# Patient Record
Sex: Female | Born: 1960 | Race: White | Hispanic: No | Marital: Married | Smoking: Former smoker
Health system: Southern US, Community
[De-identification: ages and names within clinical notes are randomized; demographics above are authoritative.]

## PROBLEM LIST (undated history)

## (undated) DIAGNOSIS — R32 Unspecified urinary incontinence: Secondary | ICD-10-CM

## (undated) DIAGNOSIS — J449 Chronic obstructive pulmonary disease, unspecified: Secondary | ICD-10-CM

## (undated) DIAGNOSIS — F419 Anxiety disorder, unspecified: Secondary | ICD-10-CM

## (undated) HISTORY — PX: BREAST BIOPSY: SHX20

## (undated) HISTORY — PX: AUGMENTATION MAMMAPLASTY: SUR837

## (undated) HISTORY — DX: Anxiety disorder, unspecified: F41.9

## (undated) HISTORY — DX: Unspecified urinary incontinence: R32

---

## 1992-02-25 HISTORY — PX: BREAST EXCISIONAL BIOPSY: SUR124

## 1998-03-15 ENCOUNTER — Other Ambulatory Visit: Admission: RE | Admit: 1998-03-15 | Discharge: 1998-03-15 | Payer: Self-pay | Admitting: Obstetrics and Gynecology

## 1998-07-03 ENCOUNTER — Encounter: Payer: Self-pay | Admitting: Emergency Medicine

## 1998-07-03 ENCOUNTER — Emergency Department (HOSPITAL_COMMUNITY): Admission: EM | Admit: 1998-07-03 | Discharge: 1998-07-03 | Payer: Self-pay | Admitting: Emergency Medicine

## 1999-12-19 ENCOUNTER — Other Ambulatory Visit: Admission: RE | Admit: 1999-12-19 | Discharge: 1999-12-19 | Payer: Self-pay | Admitting: Gynecology

## 1999-12-30 ENCOUNTER — Encounter (INDEPENDENT_AMBULATORY_CARE_PROVIDER_SITE_OTHER): Payer: Self-pay | Admitting: Specialist

## 1999-12-30 ENCOUNTER — Other Ambulatory Visit: Admission: RE | Admit: 1999-12-30 | Discharge: 1999-12-30 | Payer: Self-pay | Admitting: Obstetrics and Gynecology

## 2000-06-05 ENCOUNTER — Encounter: Admission: RE | Admit: 2000-06-05 | Discharge: 2000-09-03 | Payer: Self-pay | Admitting: Obstetrics and Gynecology

## 2000-08-07 ENCOUNTER — Inpatient Hospital Stay (HOSPITAL_COMMUNITY): Admission: AD | Admit: 2000-08-07 | Discharge: 2000-08-10 | Payer: Self-pay | Admitting: Obstetrics and Gynecology

## 2000-12-02 ENCOUNTER — Other Ambulatory Visit: Admission: RE | Admit: 2000-12-02 | Discharge: 2000-12-02 | Payer: Self-pay | Admitting: Obstetrics & Gynecology

## 2001-10-05 ENCOUNTER — Inpatient Hospital Stay (HOSPITAL_COMMUNITY): Admission: AD | Admit: 2001-10-05 | Discharge: 2001-10-05 | Payer: Self-pay | Admitting: Obstetrics & Gynecology

## 2001-12-02 ENCOUNTER — Other Ambulatory Visit: Admission: RE | Admit: 2001-12-02 | Discharge: 2001-12-02 | Payer: Self-pay | Admitting: Obstetrics and Gynecology

## 2002-01-11 ENCOUNTER — Encounter: Admission: RE | Admit: 2002-01-11 | Discharge: 2002-01-11 | Payer: Self-pay | Admitting: Obstetrics and Gynecology

## 2002-01-11 ENCOUNTER — Encounter: Payer: Self-pay | Admitting: Obstetrics and Gynecology

## 2002-12-21 ENCOUNTER — Other Ambulatory Visit: Admission: RE | Admit: 2002-12-21 | Discharge: 2002-12-21 | Payer: Self-pay | Admitting: Obstetrics and Gynecology

## 2003-12-27 ENCOUNTER — Other Ambulatory Visit: Admission: RE | Admit: 2003-12-27 | Discharge: 2003-12-27 | Payer: Self-pay | Admitting: Obstetrics and Gynecology

## 2004-03-22 ENCOUNTER — Emergency Department (HOSPITAL_COMMUNITY): Admission: EM | Admit: 2004-03-22 | Discharge: 2004-03-22 | Payer: Self-pay | Admitting: *Deleted

## 2004-06-16 ENCOUNTER — Emergency Department (HOSPITAL_COMMUNITY): Admission: EM | Admit: 2004-06-16 | Discharge: 2004-06-16 | Payer: Self-pay | Admitting: Emergency Medicine

## 2004-07-04 ENCOUNTER — Ambulatory Visit (HOSPITAL_COMMUNITY): Admission: RE | Admit: 2004-07-04 | Discharge: 2004-07-04 | Payer: Self-pay | Admitting: Obstetrics and Gynecology

## 2004-07-08 ENCOUNTER — Encounter: Admission: RE | Admit: 2004-07-08 | Discharge: 2004-07-08 | Payer: Self-pay | Admitting: Obstetrics and Gynecology

## 2004-11-12 ENCOUNTER — Ambulatory Visit (HOSPITAL_COMMUNITY): Admission: RE | Admit: 2004-11-12 | Discharge: 2004-11-12 | Payer: Self-pay | Admitting: Obstetrics and Gynecology

## 2004-11-12 ENCOUNTER — Encounter (INDEPENDENT_AMBULATORY_CARE_PROVIDER_SITE_OTHER): Payer: Self-pay | Admitting: Specialist

## 2007-04-29 ENCOUNTER — Emergency Department (HOSPITAL_COMMUNITY): Admission: EM | Admit: 2007-04-29 | Discharge: 2007-04-30 | Payer: Self-pay | Admitting: Emergency Medicine

## 2007-05-05 ENCOUNTER — Encounter: Admission: RE | Admit: 2007-05-05 | Discharge: 2007-05-05 | Payer: Self-pay | Admitting: Neurology

## 2007-09-02 ENCOUNTER — Encounter: Admission: RE | Admit: 2007-09-02 | Discharge: 2007-09-02 | Payer: Self-pay | Admitting: Neurology

## 2010-03-17 ENCOUNTER — Encounter: Payer: Self-pay | Admitting: Neurology

## 2010-07-12 NOTE — Op Note (Signed)
Taylor Hoover, Taylor Hoover               ACCOUNT NO.:  [id number]   MEDICAL RECORD NO.:  [id number]          PATIENT TYPE:  AMB   LOCATION:  SDC                           FACILITY:  [facility]   PHYSICIAN:  Miguel Aschoff, M.D.       DATE OF BIRTH:  29-Jul-1960   DATE OF PROCEDURE:  11/12/2004  DATE OF DISCHARGE:                                 OPERATIVE REPORT   PREOPERATIVE DIAGNOSIS:  Endometrial polyp.   POSTOPERATIVE DIAGNOSIS:  Endometrial polyp.   PROCEDURE:  Hysteroscopy, endometrial polypectomy.   SURGEON:  Dr. Miguel Aschoff.   ANESTHESIA:  IV sedation with paracervical block.   COMPLICATIONS:  None.   JUSTIFICATION:  The patient is a 50 year old white female  found on  sonohysterogram to have a large endometrial polyp.  Due to the lesion within  the uterus, she is being taken now to undergo hysteroscopy and polypectomy.  The risks and benefits of the procedure were discussed with the patient.   PROCEDURE:  The patient was taken to the operating, placed in the supine  position, and IV sedation was administered without difficulty.  Once this  was done, she was placed in the dorsal lithotomy position, prepped and  draped in usual sterile fashion.  The bladder was catheterized.  Examination  revealed the uterus to be anterior, normal size and shape.  The cervix  revealed no lesions.  There were no lesions of the vaginal vault.  The  external genitalia were within normal limits.  A speculum was placed in the  vaginal vault, the anterior cervical lip was grasped with a tenaculum and  then the cervix was injected with 18 mL of 1% Xylocaine by placing 6 mL at  the 12, 4 and 8 o'clock positions on the cervix.  At that this point using  serial Pratt dilators, the endocervical canal was dilated until a #25 Pratt  dilator could be passed and the diagnostic hysteroscope was advanced through  the endocervix.  No lesions were noted in the endocervix.  On entering the  endometrial cavity, however, a  large polyp was visualized.  No other lesions  were noted.  At this point the hysteroscope was removed, polyp forceps were  introduced and the polyp was removed without difficulty.  Following removal  of the polyp, hysteroscopy was once again carried out to ensure that this  had completely been removed.  This was confirmed.  At this point the  procedure was completed, the hysteroscope was removed.  There was minimal  fluid deficit.  The patient reversed from the anesthetic and taken to the  recovery in satisfactory condition.  The plan is for the patient to come  back to the office  in four weeks for follow-up examination, to call for any problems such as  fever, pain or heavy bleeding.  Medications for home included doxycycline  100 mg twice a day x3 days and Darvocet N-100 one every four hours need for  pain.      Miguel Aschoff, M.D.  Electronically Signed     AR/MEDQ  D:  11/12/2004  T:  11/12/2004  Job:  [id number]   cc:   Leatha Gilding. Mezer, M.D.  Fax: [phone]

## 2010-11-18 LAB — I-STAT 8, (EC8 V) (CONVERTED LAB)
Bicarbonate: 25.2 — ABNORMAL HIGH
Hemoglobin: 16.3 — ABNORMAL HIGH
Operator id: 270651
Sodium: 138
TCO2: 27
pCO2, Ven: 42.2 — ABNORMAL LOW

## 2010-11-18 LAB — CBC
MCHC: 34.4
Platelets: 194
RBC: 4.64
RDW: 11.6

## 2010-11-18 LAB — DIFFERENTIAL
Basophils Absolute: 0
Basophils Relative: 0
Eosinophils Absolute: 0
Neutro Abs: 5.4
Neutrophils Relative %: 76

## 2011-02-20 ENCOUNTER — Other Ambulatory Visit: Payer: Self-pay | Admitting: Otolaryngology

## 2011-02-20 DIAGNOSIS — K1120 Sialoadenitis, unspecified: Secondary | ICD-10-CM

## 2011-03-04 ENCOUNTER — Ambulatory Visit
Admission: RE | Admit: 2011-03-04 | Discharge: 2011-03-04 | Disposition: A | Discharge status: Home / Self Care | Payer: BC Managed Care – PPO | Source: Ambulatory Visit | Attending: Otolaryngology | Admitting: Otolaryngology

## 2011-03-04 DIAGNOSIS — K1120 Sialoadenitis, unspecified: Secondary | ICD-10-CM

## 2013-09-01 ENCOUNTER — Other Ambulatory Visit: Payer: Self-pay | Admitting: Obstetrics and Gynecology

## 2013-09-02 LAB — CYTOLOGY - PAP

## 2013-09-23 ENCOUNTER — Ambulatory Visit (INDEPENDENT_AMBULATORY_CARE_PROVIDER_SITE_OTHER): Payer: BC Managed Care – PPO | Admitting: Obstetrics and Gynecology

## 2013-09-23 ENCOUNTER — Encounter: Payer: Self-pay | Admitting: Obstetrics and Gynecology

## 2013-09-23 VITALS — BP 100/64 | HR 76 | Resp 16 | Ht 65.0 in | Wt 122.8 lb

## 2013-09-23 DIAGNOSIS — R8281 Pyuria: Secondary | ICD-10-CM

## 2013-09-23 DIAGNOSIS — R82998 Other abnormal findings in urine: Secondary | ICD-10-CM

## 2013-09-23 DIAGNOSIS — N393 Stress incontinence (female) (male): Secondary | ICD-10-CM

## 2013-09-23 LAB — POCT URINALYSIS DIPSTICK
Bilirubin, UA: NEGATIVE
Blood, UA: NEGATIVE
Glucose, UA: NEGATIVE
Ketones, UA: NEGATIVE
NITRITE UA: NEGATIVE
PROTEIN UA: NEGATIVE
UROBILINOGEN UA: NEGATIVE
pH, UA: 5

## 2013-09-23 NOTE — Progress Notes (Signed)
Patient ID: Taylor Hoover, female   DOB: May 14, 1960, 53 y.o.   MRN: [id number] GYNECOLOGY VISIT  PCP: None  Referring provider: Miguel AschoffAllan Ross, MD  HPI: 53 y.o.   Married  Caucasian  female   G3P0021 with Patient's last menstrual period was 02/25/2011.   here for   Evaluation of stress urinary incontinence. Leaks with cough, sneeze, laugh, trampoline.  DF - 10 - 12 times a day. NF - at least once.  Can leak for no reason.  Coffee in am.  No sodas. 2 teas per day.  Drinks a lot of water daily.  No diabetes.  Hx GDM.  Voids best when she is in the shower.   No dysuria or hematuria.  No history of UTIs, pyelo, or nephrolithiasis.   Wearing Poise pads and developing a rash due to this.   Having diarrhea.  Takes probiotics.  History of 4th degree laceration with childbirth.  Did have a VCUG to rule out fistula - negative.   Feels like the area between the vagina and rectum is very thin. Has to be very careful with bowel movements.  Can leak stool sometimes.  Also does splinting.  Occurs couple times a month.  Wants a perineorrhaphy.  Had urodynamic in 2006.   Showed stress incontinence.   Having insomnia problems. Hgb:    Urine:  1+WBC's  GYNECOLOGIC HISTORY: Patient's last menstrual period was 02/25/2011. Sexually active:  yes Partner preference: female Contraception:  postmenopausal  Menopausal hormone therapy: n/a DES exposure:   no Blood transfusions:  no  Sexually transmitted diseases:no   GYN procedures and prior surgeries:  Breast Augmentation--[facility] Last mammogram:   2006 normal:[facility]              Last pap and high risk HPV testing:   09-01-13 didn't receive results--?normal. History of abnormal pap smear:  1988 abnormal pap, no colposcopy, no treatment to cervix.  Repeat paps normal.   OB History   Grav Para Term Preterm Abortions TAB SAB Ect Mult Living   3 1   2     1        LIFESTYLE: Exercise:  no             Tobacco:  no Alcohol:    no Drug use:  no  There are no active problems to display for this patient.   Past Medical History  Diagnosis Date  . Anxiety   . Urinary incontinence     Past Surgical History  Procedure Laterality Date  . Augmentation mammaplasty      Current Outpatient Prescriptions  Medication Sig Dispense Refill  . ALPRAZolam (XANAX) 1 MG tablet Take 1 tablet by mouth as needed.       No current facility-administered medications for this visit.     ALLERGIES: Review of patient's allergies indicates no known allergies.  Family History  Problem Relation Age of Onset  . Dementia Mother   . Heart attack Brother 49    dec  . Hypertension Sister   . Diabetes Sister   . Diabetes Sister     History   Social History  . Marital Status: Married    Spouse Name: N/A    Number of Children: N/A  . Years of Education: N/A   Occupational History  . Not on file.   Social History Main Topics  . Smoking status: Never Smoker   . Smokeless tobacco: Not on file  . Alcohol Use: No  . Drug Use: No  .  Sexual Activity: Yes    Partners: Male    Birth Control/ Protection: Post-menopausal   Other Topics Concern  . Not on file   Social History Narrative  . No narrative on file    ROS:  Pertinent items are noted in HPI.  PHYSICAL EXAMINATION:    BP 100/64  Pulse 76  Resp 16  Ht 5\' 5"  (1.651 m)  Wt 122 lb 12.8 oz (55.702 kg)  BMI 20.44 kg/m2  LMP 02/25/2011   Wt Readings from Last 3 Encounters:  09/23/13 122 lb 12.8 oz (55.702 kg)     Ht Readings from Last 3 Encounters:  09/23/13 5\' 5"  (1.651 m)    General appearance: alert, cooperative and appears stated age Head: Normocephalic, without obvious abnormality, atraumatic Neck: no adenopathy, supple, symmetrical, trachea midline and thyroid not enlarged, symmetric, no tenderness/mass/nodules Lungs: clear to auscultation bilaterally Heart: regular rate and rhythm Abdomen: soft, non-tender; no masses,  no  organomegaly Extremities: extremities normal, atraumatic, no cyanosis or edema Skin: Skin color, texture, turgor normal. No rashes or lesions Lymph nodes: Cervical, supraclavicular, and axillary nodes normal. No abnormal inguinal nodes palpated Neurologic: Grossly normal  Pelvic: External genitalia:  no lesions              Urethra:  normal appearing urethra with no masses, tenderness or lesions              Bartholins and Skenes: normal                 Vagina: normal appearing vagina with normal color and discharge, no lesions, minimal cystocele and rectocele.  Good uterine support.               Cervix: normal appearance                 Bimanual Exam:  Uterus:  uterus is normal size, shape, consistency and nontender                                      Adnexa: normal adnexa in size, nontender and no masses                                      Rectovaginal: Confirms.  Thin perineal body.                                       Anus:  normal sphincter tone, no lesions  ASSESSMENT  Genuine stress incontinence.  Urinary frequency.  Heavy caffeine use.  Pyuria on urine dip today.  Asymptomatic.  History of fourth degree laceration.  Fecal incontinence.  Difficulty with bowel movements.  Overdue for mammogram.  PLAN  Proceed with urodynamic testing.  I discussed midurethral slings and perineorrhaphy with patient.  I discussed benefits and risks which include but are not limited to urinary retention, sling erosion/exposure, cystotomy, urinary urgency, infection, slower voiding, painful perineum, continued fecal soiling. Patient understands that she will likely need to see a colon and rectal specialist in the future for rectal sphincter evaluation and treatment.  Try Metamucil!  An After Visit Summary was printed and given to the patient.  30 minutes face to face time of which over 50% was spent in counseling.

## 2013-09-27 ENCOUNTER — Other Ambulatory Visit: Payer: Self-pay | Admitting: Obstetrics and Gynecology

## 2013-09-27 LAB — CULTURE, URINE COMPREHENSIVE

## 2013-09-27 MED ORDER — CIPROFLOXACIN HCL 500 MG PO TABS: 500.0000 mg | ORAL_TABLET | Freq: Two times a day (BID) | ORAL | Status: DC

## 2013-09-27 MED ORDER — FLUCONAZOLE 150 MG PO TABS: 150.0000 mg | ORAL_TABLET | Freq: Once | ORAL | Status: DC

## 2013-09-27 MED ORDER — SULFAMETHOXAZOLE-TRIMETHOPRIM 800-160 MG PO TABS: 1.0000 | ORAL_TABLET | Freq: Two times a day (BID) | ORAL | Status: DC

## 2013-09-28 ENCOUNTER — Telehealth: Payer: Self-pay

## 2013-09-28 NOTE — Telephone Encounter (Signed)
Message copied by Alphonsa OverallIXON, Muhanad Torosyan L on Wed Sep 28, 2013 10:29 AM ------      Message from: Ricki MillerAMUNDSON DE Gwenevere GhaziARVALHO E SILVA, BROOK E      Created: Tue Sep 27, 2013  7:30 PM       Please inform patient of her UTI showing 2 bacteria - Actinobacter and Staphylococcus.      She will need two different antibiotics and a Diflucan prescription just in case she gets a yeast infection from the antibiotics.       I am sending everything to her pharmacy now.       Ciprofloxocin 500 mg po bid for 3 days and Bactrim DS 1 po bid for 3 days.       She will need a test of cure in 10 days.        ------

## 2013-09-28 NOTE — Telephone Encounter (Signed)
LMOVM to call to discuss results of urine culture. 

## 2013-09-29 ENCOUNTER — Telehealth: Payer: Self-pay | Admitting: Obstetrics and Gynecology

## 2013-09-29 NOTE — Telephone Encounter (Signed)
Message copied by Vangie BickerFRANKLIN, SABRINA S on Thu Sep 29, 2013 11:48 AM ------      Message from: Ricki MillerAMUNDSON DE Gwenevere GhaziARVALHO E SILVA, BROOK E      Created: Fri Sep 23, 2013 10:10 AM      Regarding: Precert urodynamics and potential surgery       This is a former patient of mine sent by [facility].            We will need to proceed with urodynamic testing and then surgery which will likely include a TVT Exact Midurethral sling and cystoscopy and perineorrhaphy.            Diagnosis will be genuine stress incontinence, constipation.            Dr. Tenny Crawoss will be assisting.             I will put the [facility] on [employer]'s desk.             Thanks.             ------

## 2013-09-29 NOTE — Telephone Encounter (Signed)
Left message for patient to call back. Need to go over benefits for Urodynamics.

## 2013-09-29 NOTE — Telephone Encounter (Signed)
Patient notified of results and made appt for TOC for 10-11-13.

## 2013-09-29 NOTE — Telephone Encounter (Signed)
Returning a call to Amanda.

## 2013-09-29 NOTE — Telephone Encounter (Signed)
Patient returned call. Patient was advised that per the benefit quote received, she will be responsible for $606.66 when she comes in for urodynamics testing. Patient asked if we accept Care Credit, I advised that we do not. Patient states that she does not have this kind of money just lying around and wants to know if we can set up a payment arrangement. I advised that it is our office policy to collect payment at the time of service, but that I would ask office administration to contact her regarding payment arrangement. When asked that would she be able to pay, she stated $50 per month.

## 2013-10-03 ENCOUNTER — Encounter: Payer: BC Managed Care – PPO | Admitting: Obstetrics and Gynecology

## 2013-10-10 NOTE — Telephone Encounter (Signed)
Our office policy is patient payment portion up front for procedures.  I do recommend urodynamic testing based on patient's history.  She may see if she can do urodynamic testing through Dr. Charlott Rakesoss's office. I do not know what their policy is for payment of procedures.   If patient proceeds with any surgery, our office policy would be the same for patient responsible portion up front.   I anticipate that the patient would meet her deductible with urodynamics and the surgery, so patient may want to keep that in mind.  If she chooses to move forward, she would want to do both in the same calendar year.

## 2013-10-10 NOTE — Telephone Encounter (Signed)
Dr. Edward JollySilva, patient would like to pay $50.00/month which would have her paying the balance off in about 12 months. Please advise how you would like us to handle this situation.  Thanks, Sue LushAndrea

## 2013-10-11 ENCOUNTER — Ambulatory Visit (INDEPENDENT_AMBULATORY_CARE_PROVIDER_SITE_OTHER): Payer: BC Managed Care – PPO | Admitting: *Deleted

## 2013-10-11 VITALS — BP 110/60 | HR 60 | Resp 16 | Ht 65.0 in | Wt 123.0 lb

## 2013-10-11 DIAGNOSIS — N390 Urinary tract infection, site not specified: Secondary | ICD-10-CM

## 2013-10-11 NOTE — Progress Notes (Signed)
Patient in today for TOC.Patient states she "doesn't know" if she has any sx. Patient states she is finish with ABx. Advise pt to call if she has any questions or if she starts noticing sx. - Pt agreed.

## 2013-10-11 NOTE — Telephone Encounter (Signed)
Return call to patient. Patient calling regarding payment arrangements and scheduling surgery. Advised our office is unable to make payment arrangements for such an extended period of time.   Offered option of [facility] see if [facility] can allow payment plan and have urodynamics there.  Advised surgery estimate would be due two weeks prior to surgery. Wanted her to know this while she is deciding on options. Martie LeeSabrina will check estimate for surgery and call her this afternoon, advised patient that this is really just an initial estimate, particularly since has not had urodynamics yet and surgical procedure could change.  [facility] to [facility] after talking to our insurance dept and call me back with her decision.

## 2013-10-11 NOTE — Telephone Encounter (Signed)
Patient has some questions regarding her surgery. °

## 2013-10-11 NOTE — Telephone Encounter (Signed)
Left message for patient to call back so that I can go over surgery benefits with her.

## 2013-10-13 LAB — URINE CULTURE: Colony Count: 40000

## 2013-11-14 ENCOUNTER — Telehealth: Payer: Self-pay | Admitting: *Deleted

## 2013-11-14 NOTE — Telephone Encounter (Signed)
Follow-up call to patient to check on status of urodynamics and surgery. Patient states she plans to have urodynamics done at Dr Tenny Craw' office due to [facility] cost. She states they will discuss surgery after results received. I asked when she was scheduled for urodynamics and patietn states "I dont know, I am on the other line with my mom. Stacy (from Dr Tenny Craw) will be in touch with you".   Any further follow-up needed?

## 2013-11-15 NOTE — Telephone Encounter (Signed)
Taylor Hoover, Just FYI, so you do not have to contact her further. We can defer any outstanding orders on her and  If she does not call back in 30 days then we can complete them.

## 2013-11-15 NOTE — Telephone Encounter (Signed)
No follow up needed.  We will let the patient contact us back if she chooses to proceed with any further care. I will close the encounter.

## 2013-11-17 NOTE — Telephone Encounter (Signed)
See next phone message. Patient will call back when ready to proceed.  Routing to provider for final review. Patient agreeable to disposition. Will close encounter

## 2013-12-09 ENCOUNTER — Other Ambulatory Visit: Payer: Self-pay

## 2013-12-26 ENCOUNTER — Encounter: Payer: Self-pay | Admitting: Obstetrics and Gynecology

## 2014-12-14 ENCOUNTER — Telehealth: Payer: Self-pay | Admitting: Obstetrics and Gynecology

## 2014-12-14 NOTE — Telephone Encounter (Signed)
Patient would like a consult visit with Dr Edward JollySilva to discuss surgery that was mentioned to her before.

## 2014-12-14 NOTE — Telephone Encounter (Signed)
Spoke with patient. Patient states that she would like to come in for a consult with Dr.Silva regarding urinary incontinence. Patient states that over the last year her symptoms have increase and "now it is a health issue for me. I am ready to have any testing and move forward with surgery." Requesting an appointment between 10 am and 1 pm due to her sons school schedule. Appointment scheduled for 10/26 at 1pm with Dr.Silva. Agreeable to date and time.  Routing to provider for final review. Patient agreeable to disposition. Will close encounter.

## 2014-12-20 ENCOUNTER — Telehealth: Payer: Self-pay | Admitting: Obstetrics and Gynecology

## 2014-12-20 ENCOUNTER — Ambulatory Visit: Payer: Self-pay | Admitting: Obstetrics and Gynecology

## 2014-12-20 NOTE — Telephone Encounter (Signed)
Patient called to cancel appointment for today at 1:00 with Dr. Edward JollySilva due to her having to pick up her sick child. Patient says she will call back to reschedule.

## 2014-12-20 NOTE — Telephone Encounter (Signed)
Thank you for the update!

## 2015-06-18 DIAGNOSIS — T8544XA Capsular contracture of breast implant, initial encounter: Secondary | ICD-10-CM | POA: Insufficient documentation

## 2015-06-19 ENCOUNTER — Other Ambulatory Visit: Payer: Self-pay | Admitting: Plastic Surgery

## 2015-06-19 DIAGNOSIS — N644 Mastodynia: Secondary | ICD-10-CM

## 2015-06-22 ENCOUNTER — Ambulatory Visit
Admission: RE | Admit: 2015-06-22 | Discharge: 2015-06-22 | Disposition: A | Discharge status: Home / Self Care | Payer: BLUE CROSS/BLUE SHIELD | Source: Ambulatory Visit | Attending: Plastic Surgery | Admitting: Plastic Surgery

## 2015-06-22 DIAGNOSIS — N644 Mastodynia: Secondary | ICD-10-CM

## 2015-09-11 ENCOUNTER — Other Ambulatory Visit: Payer: Self-pay | Admitting: Plastic Surgery

## 2016-07-31 ENCOUNTER — Other Ambulatory Visit: Payer: Self-pay | Admitting: Plastic Surgery

## 2016-07-31 DIAGNOSIS — Z1231 Encounter for screening mammogram for malignant neoplasm of breast: Secondary | ICD-10-CM

## 2016-08-13 ENCOUNTER — Ambulatory Visit
Admission: RE | Admit: 2016-08-13 | Discharge: 2016-08-13 | Disposition: A | Discharge status: Home / Self Care | Payer: BLUE CROSS/BLUE SHIELD | Source: Ambulatory Visit | Attending: Plastic Surgery | Admitting: Plastic Surgery

## 2016-08-13 DIAGNOSIS — Z1231 Encounter for screening mammogram for malignant neoplasm of breast: Secondary | ICD-10-CM

## 2016-08-14 ENCOUNTER — Other Ambulatory Visit: Payer: Self-pay | Admitting: Plastic Surgery

## 2016-08-14 DIAGNOSIS — R928 Other abnormal and inconclusive findings on diagnostic imaging of breast: Secondary | ICD-10-CM

## 2016-08-18 ENCOUNTER — Ambulatory Visit
Admission: RE | Admit: 2016-08-18 | Discharge: 2016-08-18 | Disposition: A | Discharge status: Home / Self Care | Payer: BLUE CROSS/BLUE SHIELD | Source: Ambulatory Visit | Attending: Plastic Surgery | Admitting: Plastic Surgery

## 2016-08-18 ENCOUNTER — Other Ambulatory Visit: Payer: Self-pay | Admitting: Plastic Surgery

## 2016-08-18 DIAGNOSIS — N63 Unspecified lump in unspecified breast: Secondary | ICD-10-CM

## 2016-08-18 DIAGNOSIS — R928 Other abnormal and inconclusive findings on diagnostic imaging of breast: Secondary | ICD-10-CM

## 2016-08-21 ENCOUNTER — Other Ambulatory Visit: Payer: Self-pay | Admitting: Plastic Surgery

## 2016-08-21 DIAGNOSIS — N63 Unspecified lump in unspecified breast: Secondary | ICD-10-CM

## 2016-08-29 ENCOUNTER — Other Ambulatory Visit: Payer: Self-pay | Admitting: Family Medicine

## 2016-08-29 DIAGNOSIS — N63 Unspecified lump in unspecified breast: Secondary | ICD-10-CM

## 2016-11-20 ENCOUNTER — Inpatient Hospital Stay: Admission: RE | Admit: 2016-11-20 | Payer: BLUE CROSS/BLUE SHIELD | Source: Ambulatory Visit

## 2016-11-20 ENCOUNTER — Other Ambulatory Visit: Payer: BLUE CROSS/BLUE SHIELD

## 2018-09-17 ENCOUNTER — Other Ambulatory Visit: Payer: Self-pay | Admitting: Family Medicine

## 2018-09-17 DIAGNOSIS — Z1231 Encounter for screening mammogram for malignant neoplasm of breast: Secondary | ICD-10-CM

## 2018-09-17 DIAGNOSIS — N6489 Other specified disorders of breast: Secondary | ICD-10-CM

## 2019-06-02 ENCOUNTER — Ambulatory Visit: Payer: BC Managed Care – PPO | Admitting: Podiatry

## 2019-06-02 ENCOUNTER — Other Ambulatory Visit: Payer: Self-pay | Admitting: Podiatry

## 2019-06-02 ENCOUNTER — Other Ambulatory Visit: Payer: Self-pay

## 2019-06-02 ENCOUNTER — Encounter: Payer: Self-pay | Admitting: Podiatry

## 2019-06-02 ENCOUNTER — Ambulatory Visit (INDEPENDENT_AMBULATORY_CARE_PROVIDER_SITE_OTHER): Payer: BC Managed Care – PPO

## 2019-06-02 VITALS — BP 125/68 | HR 69 | Temp 95.1°F | Resp 16

## 2019-06-02 DIAGNOSIS — M205X1 Other deformities of toe(s) (acquired), right foot: Secondary | ICD-10-CM | POA: Diagnosis not present

## 2019-06-02 DIAGNOSIS — M79671 Pain in right foot: Secondary | ICD-10-CM

## 2019-06-02 DIAGNOSIS — M779 Enthesopathy, unspecified: Secondary | ICD-10-CM | POA: Diagnosis not present

## 2019-06-02 DIAGNOSIS — M79674 Pain in right toe(s): Secondary | ICD-10-CM

## 2019-06-02 MED ORDER — DICLOFENAC SODIUM 1 % EX GEL: 2.0000 g | Freq: Four times a day (QID) | CUTANEOUS | 2 refills | Status: DC

## 2019-06-06 NOTE — Progress Notes (Signed)
Subjective:   Patient ID: Taylor Hoover, female   DOB: 59 y.o.   MRN: [id number]   HPI 59 year old female presents the office today for concerns of a possible bunion on the right side.  Is been getting worse over the last 2 to 3 months and she has noticed increased discomfort over this timeframe.  She states that time she will get redness and discoloration on the area of the bump on the big toe joint.  She denies any recent injury.  She said no recent treatment.  No other concerns.   Review of Systems  All other systems reviewed and are negative.  Past Medical History:  Diagnosis Date  . Anxiety   . Urinary incontinence     Past Surgical History:  Procedure Laterality Date  . AUGMENTATION MAMMAPLASTY Bilateral    Re implanted 2017  . BREAST BIOPSY Left   . BREAST EXCISIONAL BIOPSY Left 1994     Current Outpatient Medications:  .  albuterol (PROVENTIL) (2.5 MG/3ML) 0.083% nebulizer solution, Take 2.5 mg by nebulization every 6 (six) hours as needed for wheezing or shortness of breath., Disp: , Rfl:  .  ALPRAZolam (XANAX) 1 MG tablet, Take 1 tablet by mouth as needed., Disp: , Rfl:  .  diclofenac Sodium (VOLTAREN) 1 % GEL, Apply 2 g topically 4 (four) times daily. Rub into affected area of foot 2 to 4 times daily, Disp: 100 g, Rfl: 2  Allergies  Allergen Reactions  . Neosporin Plus Max [employer]  . Benzalkonium Chloride Rash          Objective:  Physical Exam  General: AAO x3, NAD-high anxiety  Dermatological: Skin is warm, dry and supple bilateral. Nails x 10 are well manicured; remaining integument appears unremarkable at this time. There are no open sores, no preulcerative lesions, no rash or signs of infection present.  Vascular: Dorsalis Pedis artery and Posterior Tibial artery pedal pulses are 2/4 bilateral with immedate capillary fill time. Pedal hair growth present. No varicosities and no lower extremity edema present bilateral. There is no pain with calf  compression, swelling, warmth, erythema.   Neruologic: Grossly intact via light touch bilateral.   Musculoskeletal: There is decreased range of motion of the first MPJ on the right side there is crepitation with range of motion.  There is dorsal prominence of the first MPJ with slight erythema point rubs inside shoes.  There is no purple or blue discoloration today.  No other areas of discomfort identified today.  Muscular strength 5/5 in all groups tested bilateral.  Gait: Unassisted, Nonantalgic.       Assessment:   Hallux limitus, capsulitis right first MPJ    Plan:  -Treatment options discussed including all alternatives, risks, and complications -Etiology of symptoms were discussed -X-rays were obtained and reviewed with the patient.  Arthritic changes present the first MPJ.  No evidence of acute fracture. -We discussed treatment options with conservative as well as surgical.  Would continue with conservative care for now.  Dispensed a graphite insert.  Voltaren gel.  Consider steroid injection.  Discussed orthotics with a Morton's extension.  She was measured today by Raiford Noble for this.   Return in about 3 weeks (around 06/23/2019).  Vivi Barrack DPM

## 2019-06-13 ENCOUNTER — Telehealth: Payer: Self-pay | Admitting: Podiatry

## 2019-06-13 NOTE — Telephone Encounter (Signed)
Void-no message required

## 2019-06-14 ENCOUNTER — Other Ambulatory Visit: Payer: BC Managed Care – PPO | Admitting: Orthotics

## 2019-06-15 ENCOUNTER — Ambulatory Visit: Payer: BC Managed Care – PPO | Admitting: Podiatry

## 2019-06-30 ENCOUNTER — Emergency Department (HOSPITAL_COMMUNITY)
Admission: EM | Admit: 2019-06-30 | Discharge: 2019-06-30 | Disposition: A | Discharge status: Home / Self Care | Payer: BLUE CROSS/BLUE SHIELD | Attending: Emergency Medicine | Admitting: Emergency Medicine

## 2019-06-30 ENCOUNTER — Other Ambulatory Visit: Payer: Self-pay

## 2019-06-30 ENCOUNTER — Encounter (HOSPITAL_COMMUNITY): Payer: Self-pay | Admitting: Emergency Medicine

## 2019-06-30 DIAGNOSIS — Y999 Unspecified external cause status: Secondary | ICD-10-CM | POA: Diagnosis not present

## 2019-06-30 DIAGNOSIS — Y92832 Beach as the place of occurrence of the external cause: Secondary | ICD-10-CM | POA: Diagnosis not present

## 2019-06-30 DIAGNOSIS — Z5321 Procedure and treatment not carried out due to patient leaving prior to being seen by health care provider: Secondary | ICD-10-CM | POA: Diagnosis not present

## 2019-06-30 DIAGNOSIS — S9032XA Contusion of left foot, initial encounter: Secondary | ICD-10-CM | POA: Diagnosis not present

## 2019-06-30 DIAGNOSIS — W25XXXA Contact with sharp glass, initial encounter: Secondary | ICD-10-CM | POA: Diagnosis not present

## 2019-06-30 DIAGNOSIS — Y9301 Activity, walking, marching and hiking: Secondary | ICD-10-CM | POA: Insufficient documentation

## 2019-06-30 DIAGNOSIS — S99922A Unspecified injury of left foot, initial encounter: Secondary | ICD-10-CM | POA: Diagnosis present

## 2019-06-30 HISTORY — DX: Chronic obstructive pulmonary disease, unspecified: J44.9

## 2019-06-30 LAB — COMPREHENSIVE METABOLIC PANEL
ALT: 16 U/L (ref 0–44)
AST: 22 U/L (ref 15–41)
Albumin: 4.5 g/dL (ref 3.5–5.0)
Alkaline Phosphatase: 63 U/L (ref 38–126)
Anion gap: 7 (ref 5–15)
BUN: <5 mg/dL — ABNORMAL LOW (ref 6–20)
CO2: 28 mmol/L (ref 22–32)
Calcium: 9.6 mg/dL (ref 8.9–10.3)
Chloride: 105 mmol/L (ref 98–111)
Creatinine, Ser: 0.65 mg/dL (ref 0.44–1.00)
GFR calc Af Amer: >60 mL/min (ref >60)
GFR calc non Af Amer: >60 mL/min (ref >60)
Glucose, Bld: 152 mg/dL — ABNORMAL HIGH (ref 70–99)
Potassium: 4.5 mmol/L (ref 3.5–5.1)
Sodium: 140 mmol/L (ref 135–145)
Total Bilirubin: 0.5 mg/dL (ref 0.3–1.2)
Total Protein: 7.4 g/dL (ref 6.5–8.1)

## 2019-06-30 LAB — CBC WITH DIFFERENTIAL/PLATELET
Abs Immature Granulocytes: 0.01 10*3/uL (ref 0.00–0.07)
Basophils Absolute: 0.1 10*3/uL (ref 0.0–0.1)
Basophils Relative: 1 %
Eosinophils Absolute: 0.1 10*3/uL (ref 0.0–0.5)
Eosinophils Relative: 2 %
HCT: 43.2 % (ref 36.0–46.0)
Hemoglobin: 14.4 g/dL (ref 12.0–15.0)
Immature Granulocytes: 0 %
Lymphocytes Relative: 35 %
Lymphs Abs: 1.8 10*3/uL (ref 0.7–4.0)
MCH: 30.8 pg (ref 26.0–34.0)
MCHC: 33.3 g/dL (ref 30.0–36.0)
MCV: 92.5 fL (ref 80.0–100.0)
Monocytes Absolute: 0.4 10*3/uL (ref 0.1–1.0)
Monocytes Relative: 7 %
Neutro Abs: 2.9 10*3/uL (ref 1.7–7.7)
Neutrophils Relative %: 55 %
Platelets: 229 10*3/uL (ref 150–400)
RBC: 4.67 MIL/uL (ref 3.87–5.11)
RDW: 12 % (ref 11.5–15.5)
WBC: 5.3 10*3/uL (ref 4.0–10.5)
nRBC: 0 % (ref 0.0–0.2)

## 2019-06-30 LAB — LACTIC ACID, PLASMA: Lactic Acid, Venous: 1.4 mmol/L (ref 0.5–1.9)

## 2019-06-30 MED ORDER — SODIUM CHLORIDE 0.9% FLUSH: 3.0000 mL | Freq: Once | INTRAVENOUS | Status: DC

## 2019-06-30 NOTE — ED Notes (Signed)
Pt named called for an updated set of vitals, no response 

## 2019-06-30 NOTE — ED Triage Notes (Addendum)
Pt [place] her [place] foot on a shell or glass. Pt's foot is red/ has bruising that seems to be spreading. Puncture site has healed. No oozing noted. Pt's MD sent her here to rule out necrotizing fasciatus.

## 2019-09-27 NOTE — Progress Notes (Deleted)
59 y.o. G40P0021 Married Caucasian female here for annual exam.    PCP:     Patient's last menstrual period was 02/25/2011.           Sexually active: {yes no:314532}  The current method of family planning is post menopausal status.    Exercising: {yes no:314532}  {types:19826} Smoker:  no  Health Maintenance: Pap: 09-01-13 Neg History of abnormal Pap:  Yes, 1988 abnormal pap, no colpo, no treatment. MMG:  ***08-13-16 Implants/asymmetry both breasts--See Epic Colonoscopy:  *** BMD:   ***  Result  *** TDaP:  *** Gardasil:   {YES NO:22349} HIV:*** Hep C:*** Screening Labs:  Hb today: ***, Urine today: ***   reports that she has quit smoking. Her smoking use included cigarettes. She has never used smokeless tobacco. She reports that she does not drink alcohol and does not use drugs.  Past Medical History:  Diagnosis Date  . Anxiety   . COPD (chronic obstructive pulmonary disease) (HCC)   . Urinary incontinence     Past Surgical History:  Procedure Laterality Date  . AUGMENTATION MAMMAPLASTY Bilateral    Re implanted 2017  . BREAST BIOPSY Left   . BREAST EXCISIONAL BIOPSY Left 1994    Current Outpatient Medications  Medication Sig Dispense Refill  . albuterol (PROVENTIL) (2.5 MG/3ML) 0.083% nebulizer solution Take 2.5 mg by nebulization every 6 (six) hours as needed for wheezing or shortness of breath.    . ALPRAZolam (XANAX) 1 MG tablet Take 1 tablet by mouth as needed.    . diclofenac Sodium (VOLTAREN) 1 % GEL Apply 2 g topically 4 (four) times daily. Rub into affected area of foot 2 to 4 times daily 100 g 2   No current facility-administered medications for this visit.    Family History  Problem Relation Age of Onset  . Dementia Mother   . Heart attack Brother 49       dec  . Hypertension Sister   . Diabetes Sister   . Diabetes Sister   . Breast cancer Neg Hx     Review of Systems  Exam:   LMP 02/25/2011     General appearance: alert, cooperative and  appears stated age Head: normocephalic, without obvious abnormality, atraumatic Neck: no adenopathy, supple, symmetrical, trachea midline and thyroid normal to inspection and palpation Lungs: clear to auscultation bilaterally Breasts: normal appearance, no masses or tenderness, No nipple retraction or dimpling, No nipple discharge or bleeding, No axillary adenopathy Heart: regular rate and rhythm Abdomen: soft, non-tender; no masses, no organomegaly Extremities: extremities normal, atraumatic, no cyanosis or edema Skin: skin color, texture, turgor normal. No rashes or lesions Lymph nodes: cervical, supraclavicular, and axillary nodes normal. Neurologic: grossly normal  Pelvic: External genitalia:  no lesions              No abnormal inguinal nodes palpated.              Urethra:  normal appearing urethra with no masses, tenderness or lesions              Bartholins and Skenes: normal                 Vagina: normal appearing vagina with normal color and discharge, no lesions              Cervix: no lesions              Pap taken: {yes no:314532} Bimanual Exam:  Uterus:  normal size, contour, position, consistency,  mobility, non-tender              Adnexa: no mass, fullness, tenderness              Rectal exam: {yes no:314532}.  Confirms.              Anus:  normal sphincter tone, no lesions  Chaperone was present for exam.  Assessment:   Well woman visit with normal exam.   Plan: Mammogram screening discussed. Self breast awareness reviewed. Pap and HR HPV as above. Guidelines for Calcium, Vitamin D, regular exercise program including cardiovascular and weight bearing exercise.   Follow up annually and prn.   Additional counseling given.  {yes [id number]. _______ minutes face to face time of which over 50% was spent in counseling.    After visit summary provided.

## 2019-09-28 ENCOUNTER — Encounter: Payer: BC Managed Care – PPO | Admitting: Obstetrics and Gynecology

## 2019-10-20 ENCOUNTER — Other Ambulatory Visit: Payer: Self-pay | Admitting: Family Medicine

## 2019-10-20 DIAGNOSIS — N6489 Other specified disorders of breast: Secondary | ICD-10-CM

## 2019-11-11 ENCOUNTER — Ambulatory Visit
Admission: RE | Admit: 2019-11-11 | Discharge: 2019-11-11 | Disposition: A | Discharge status: Home / Self Care | Payer: BLUE CROSS/BLUE SHIELD | Source: Ambulatory Visit | Attending: Family Medicine | Admitting: Family Medicine

## 2019-11-11 ENCOUNTER — Other Ambulatory Visit: Payer: BLUE CROSS/BLUE SHIELD

## 2019-11-11 ENCOUNTER — Ambulatory Visit: Payer: BLUE CROSS/BLUE SHIELD

## 2019-11-11 ENCOUNTER — Other Ambulatory Visit: Payer: Self-pay

## 2019-11-11 DIAGNOSIS — N6489 Other specified disorders of breast: Secondary | ICD-10-CM

## 2020-06-13 ENCOUNTER — Encounter: Payer: Self-pay | Admitting: Podiatry

## 2020-06-13 ENCOUNTER — Other Ambulatory Visit: Payer: Self-pay

## 2020-06-13 ENCOUNTER — Ambulatory Visit (INDEPENDENT_AMBULATORY_CARE_PROVIDER_SITE_OTHER): Payer: BC Managed Care – PPO

## 2020-06-13 ENCOUNTER — Ambulatory Visit: Payer: BC Managed Care – PPO | Admitting: Podiatry

## 2020-06-13 DIAGNOSIS — M79671 Pain in right foot: Secondary | ICD-10-CM

## 2020-06-13 DIAGNOSIS — S92354A Nondisplaced fracture of fifth metatarsal bone, right foot, initial encounter for closed fracture: Secondary | ICD-10-CM | POA: Diagnosis not present

## 2020-06-14 NOTE — Progress Notes (Signed)
Subjective:   Patient ID: Taylor Hoover, female   DOB: 60 y.o.   MRN: [id number]   HPI Patient presents stating that she turned her right foot severely hurt a pop and she is had a lot of swelling and pain.  She has been diagnosed with fracture but not sure as to what is going on in this particular instance.  This injury occurred on 06/08/2020   ROS      Objective:  Physical Exam  Neurovascular status found to be intact negative Denna Haggard' sign noted good digital perfusion with patient having a lot of swelling in the outside of the right foot around the base of the fifth metatarsal with +2 pitting edema within this area and discomfort of a significant nature.  Patient is wearing a rigid bottom surgical shoe and is weightbearing     Assessment:  Probability for fracture of the base of the fifth metatarsal cannot rule out what type of pathology     Plan:  H&P and x-ray was done currently.  I did review the x-ray indicating that there is a small crack of the fifth metatarsal base but it is stable and it does not seem to be getting worse from previous x-rays.  At this point I went ahead and I applied ankle compression stocking I discussed aggressive ice therapy compression therapy elevation continued surgical shoe usage and that total recovery should take 8 weeks approximate but it should do well long-term  X-rays indicate that there is a nondisplaced fracture base of fifth metatarsal right secondary to injury

## 2021-02-20 IMAGING — MG MM  DIGITAL DIAGNOSTIC BREAST BILAT IMPLANT W/ TOMO W/ CAD
8 of 12 series · 8 of 28 positions shown · non-contrast
Comparison: Previous exam(s).

CLINICAL DATA: 59-year-old female with history of silicone implant
rupture and replacement in 3036. Bilateral mammograms and followup
mammographic hyperdense area in the UPPER LEFT breast/LEFT axilla.

EXAM:
DIGITAL DIAGNOSTIC BILATERAL MAMMOGRAM WITH IMPLANTS, CAD AND TOMO
ULTRASOUND LEFT BREAST
The patient has retropectoral implants. Standard and implant
displaced views were performed.

[R MLO]
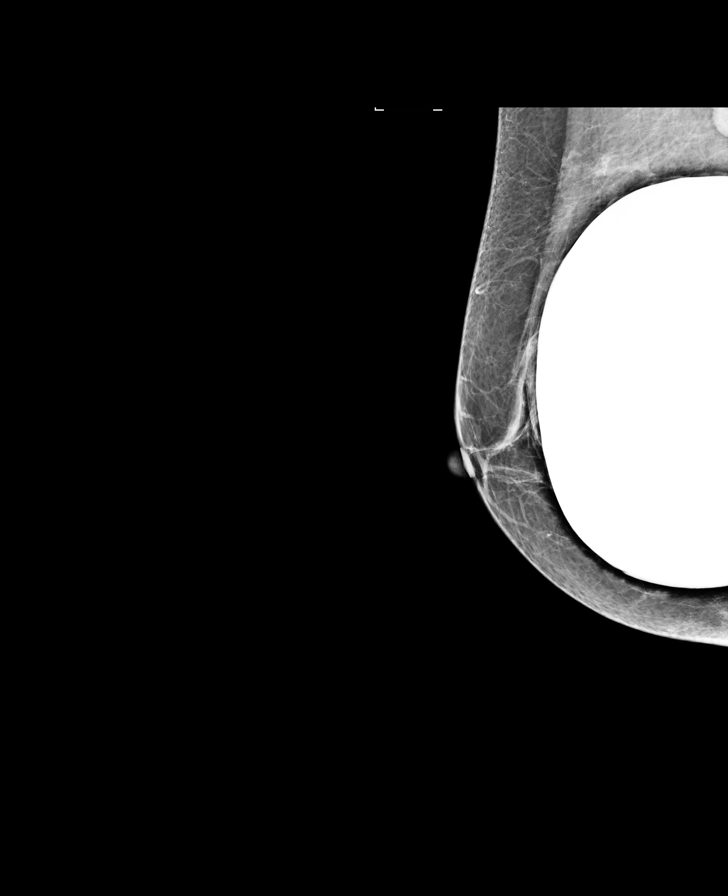

[L MLO]
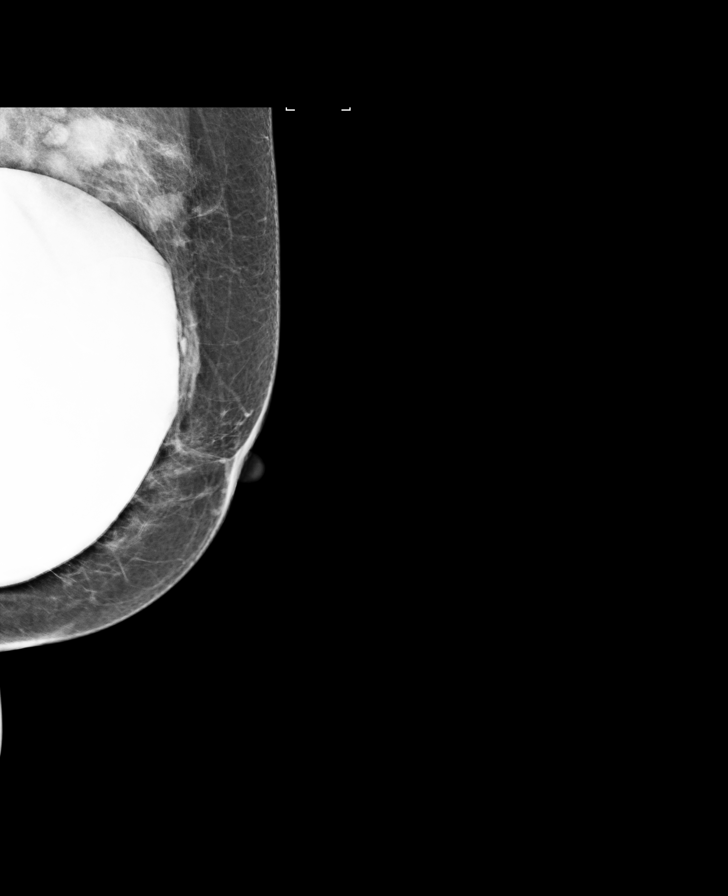

[L CC]
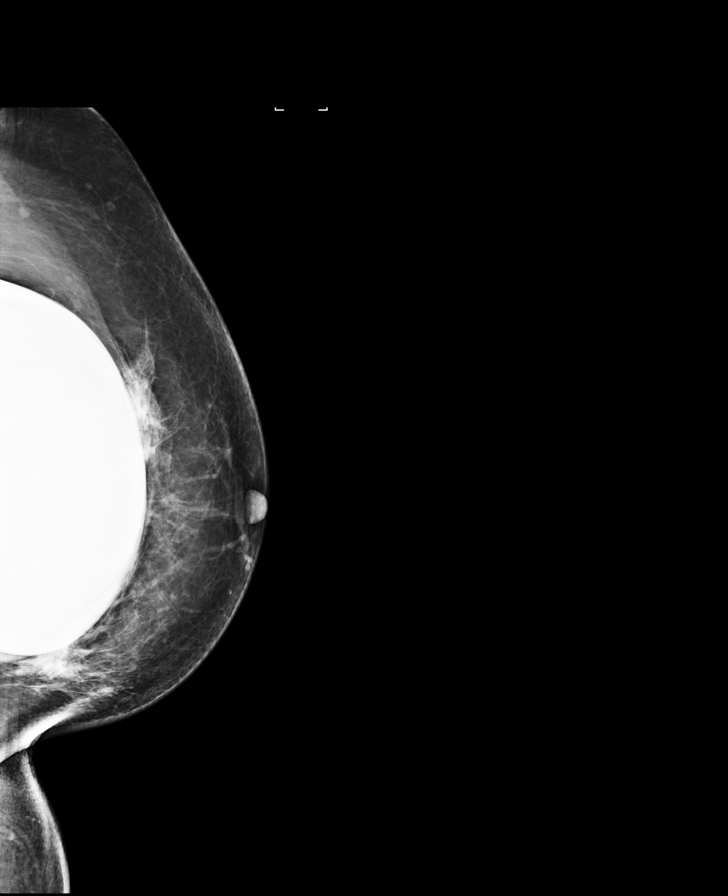

[R CC]
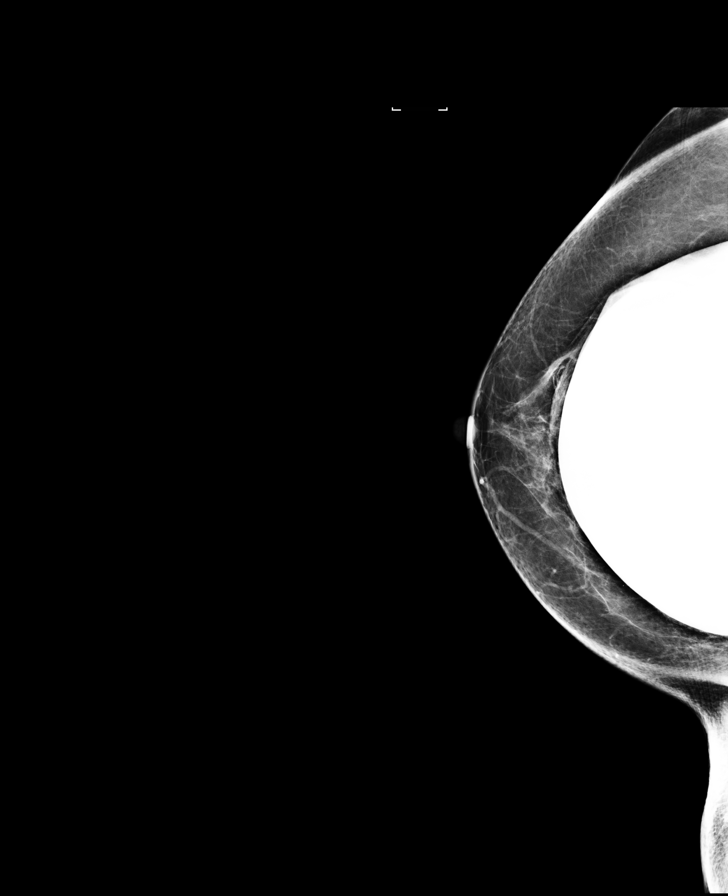

[R MLO synth-2D]
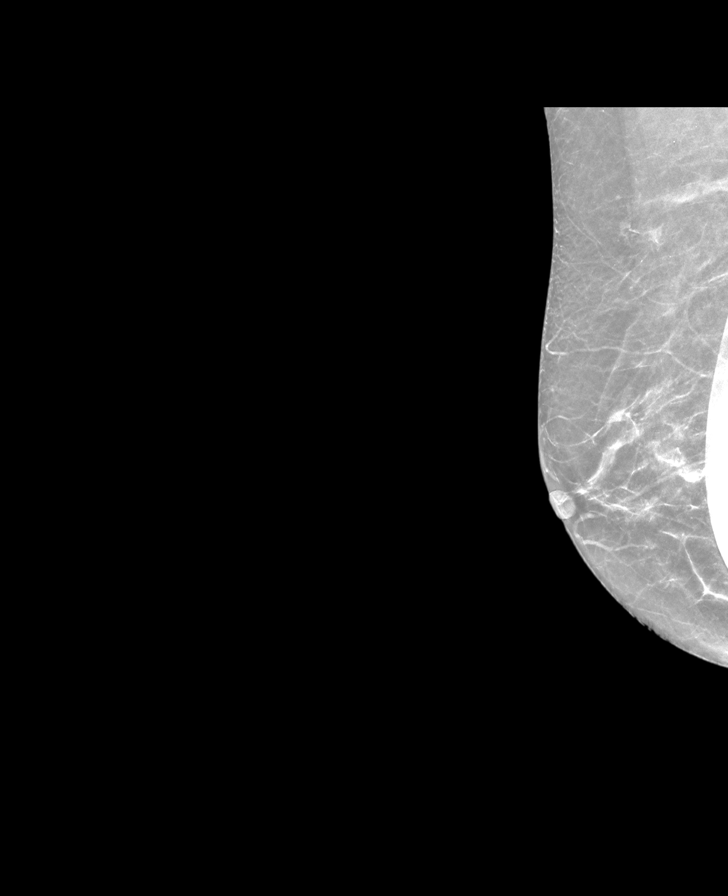

[L MLO synth-2D]
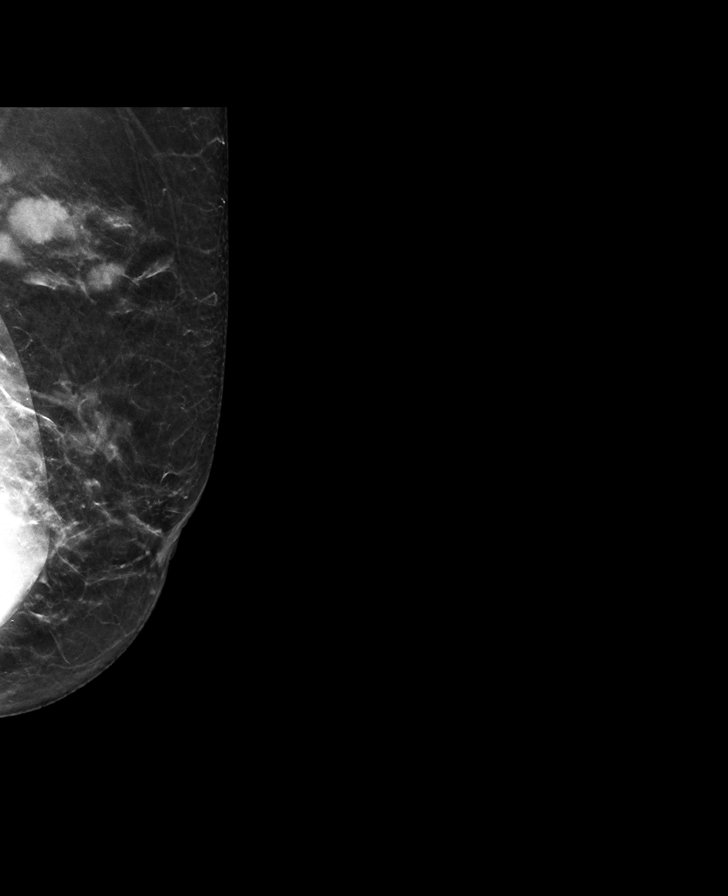

[L CC synth-2D]
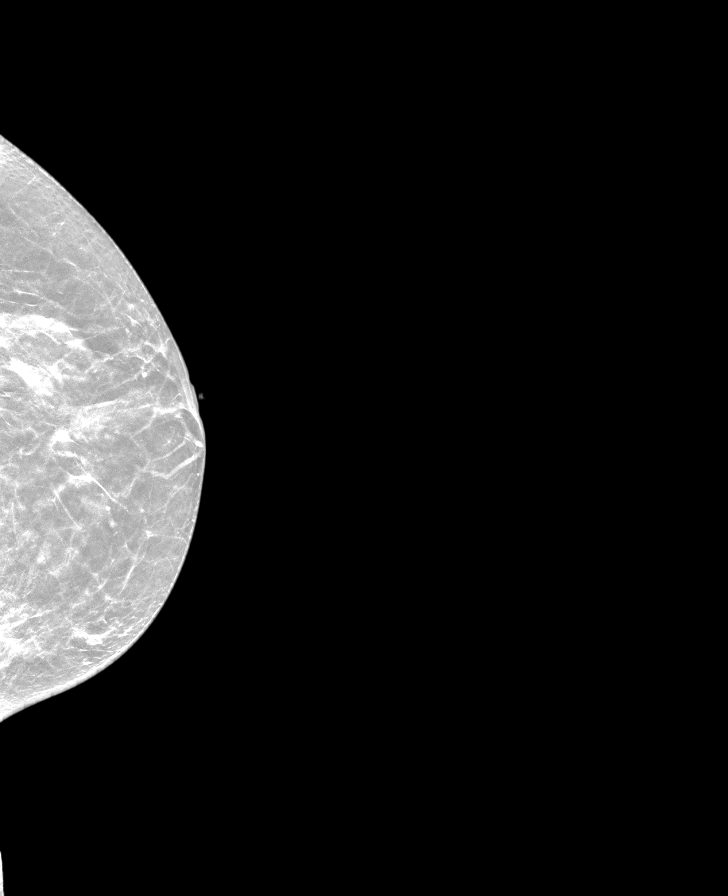

[R CC synth-2D]
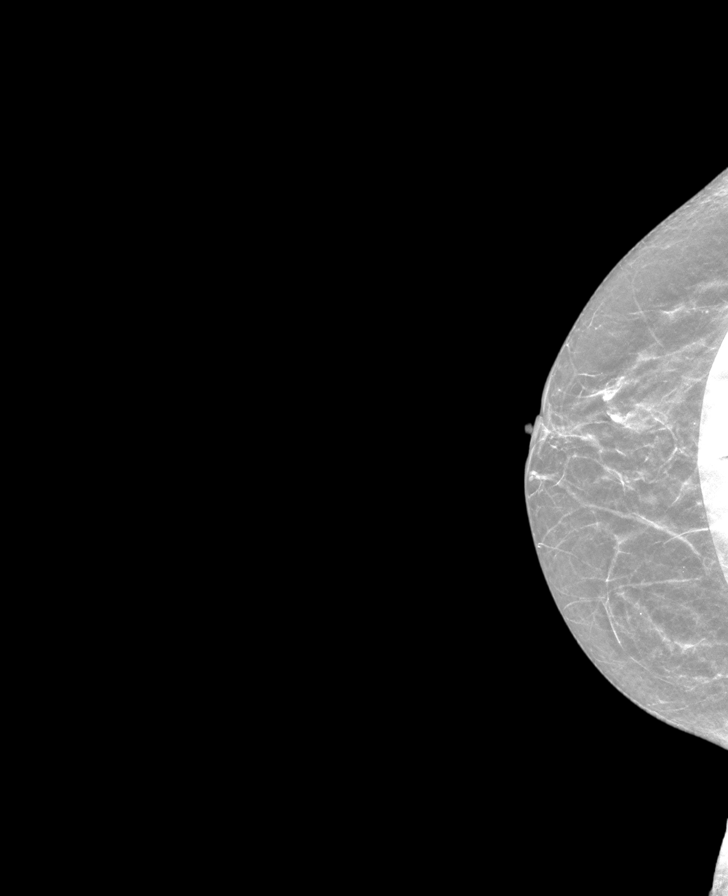

[8 of 28 positions shown; findings below may reference images not displayed]

ACR Breast Density Category b: There are scattered areas of
fibroglandular density.
FINDINGS: 2D/3D full field views of both breasts again demonstrate
extracapsular silicone bilaterally, greatest in the UPPER LEFT
breast and LEFT axilla. Silicone containing lymph nodes within the
axillary regions are again noted.

No suspicious mass, nonsurgical distortion or worrisome
calcifications are noted.

Mammographic images were processed with CAD.

Targeted ultrasound is performed, showing free silicone within the
UPPER LEFT breast/LEFT axilla and silicone within LEFT axillary
lymph nodes. No suspicious mass, nonsurgical distortion or
worrisome shadowing identified.
IMPRESSION: 1. No evidence of breast malignancy.
2. Silicone within both breasts and LEFT axilla again noted.

RECOMMENDATION:
Bilateral screening mammogram in 1 year.

I have discussed the findings and recommendations with the patient.
If applicable, a reminder letter will be sent to the patient
regarding the next appointment.

BI-RADS CATEGORY  2: Benign.

## 2022-11-10 ENCOUNTER — Other Ambulatory Visit: Payer: Self-pay | Admitting: Family Medicine

## 2022-11-10 DIAGNOSIS — Z1231 Encounter for screening mammogram for malignant neoplasm of breast: Secondary | ICD-10-CM

## 2023-06-02 ENCOUNTER — Ambulatory Visit

## 2023-07-21 LAB — COLOGUARD: COLOGUARD: POSITIVE — AB
